# Patient Record
Sex: Male | Born: 1985 | Race: Black or African American | Hispanic: No | Marital: Single | Smoking: Never smoker
Health system: Southern US, Community
[De-identification: ages and names within clinical notes are randomized; demographics above are authoritative.]

## PROBLEM LIST (undated history)

## (undated) DIAGNOSIS — R519 Headache, unspecified: Secondary | ICD-10-CM

## (undated) DIAGNOSIS — Z789 Other specified health status: Secondary | ICD-10-CM

## (undated) HISTORY — PX: WISDOM TOOTH EXTRACTION: SHX21

---
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
## 2016-06-05 ENCOUNTER — Encounter (HOSPITAL_BASED_OUTPATIENT_CLINIC_OR_DEPARTMENT_OTHER): Payer: Self-pay

## 2016-06-05 ENCOUNTER — Emergency Department (HOSPITAL_BASED_OUTPATIENT_CLINIC_OR_DEPARTMENT_OTHER): Payer: Self-pay

## 2016-06-05 ENCOUNTER — Emergency Department (HOSPITAL_BASED_OUTPATIENT_CLINIC_OR_DEPARTMENT_OTHER)
Admission: EM | Admit: 2016-06-05 | Discharge: 2016-06-05 | Disposition: A | Discharge status: Home / Self Care | Payer: Self-pay | Attending: Emergency Medicine | Admitting: Emergency Medicine

## 2016-06-05 DIAGNOSIS — K611 Rectal abscess: Secondary | ICD-10-CM | POA: Insufficient documentation

## 2016-06-05 LAB — BASIC METABOLIC PANEL
Anion gap: 9 (ref 5–15)
BUN: 8 mg/dL (ref 6–20)
CO2: 27 mmol/L (ref 22–32)
Calcium: 9.2 mg/dL (ref 8.9–10.3)
Chloride: 103 mmol/L (ref 101–111)
Creatinine, Ser: 0.71 mg/dL (ref 0.61–1.24)
GFR calc Af Amer: >60 mL/min (ref >60)
GFR calc non Af Amer: >60 mL/min (ref >60)
Glucose, Bld: 117 mg/dL — ABNORMAL HIGH (ref 65–99)
Potassium: 3.3 mmol/L — ABNORMAL LOW (ref 3.5–5.1)
Sodium: 139 mmol/L (ref 135–145)

## 2016-06-05 LAB — CBC WITH DIFFERENTIAL/PLATELET
Basophils Absolute: 0 10*3/uL (ref 0.0–0.1)
Basophils Relative: 0 %
Eosinophils Absolute: 0.1 10*3/uL (ref 0.0–0.7)
Eosinophils Relative: 1 %
HCT: 40.9 % (ref 39.0–52.0)
Hemoglobin: 14.4 g/dL (ref 13.0–17.0)
Lymphocytes Relative: 12 %
Lymphs Abs: 1.5 10*3/uL (ref 0.7–4.0)
MCH: 29.3 pg (ref 26.0–34.0)
MCHC: 35.2 g/dL (ref 30.0–36.0)
MCV: 83.3 fL (ref 78.0–100.0)
Monocytes Absolute: 1 10*3/uL (ref 0.1–1.0)
Monocytes Relative: 8 %
Neutro Abs: 9.8 10*3/uL — ABNORMAL HIGH (ref 1.7–7.7)
Neutrophils Relative %: 79 %
Platelets: 257 10*3/uL (ref 150–400)
RBC: 4.91 MIL/uL (ref 4.22–5.81)
RDW: 13.1 % (ref 11.5–15.5)
WBC: 12.4 10*3/uL — ABNORMAL HIGH (ref 4.0–10.5)

## 2016-06-05 MED ORDER — MORPHINE SULFATE (PF) 4 MG/ML IV SOLN
4.0000 mg | Freq: Once | INTRAVENOUS | Status: AC
  Administered 2016-06-05: 4 mg via INTRAVENOUS
  Filled 2016-06-05: qty 1

## 2016-06-05 MED ORDER — SODIUM CHLORIDE 0.9 % IV BOLUS (SEPSIS)
500.0000 mL | Freq: Once | INTRAVENOUS | Status: AC
  Administered 2016-06-05: 500 mL via INTRAVENOUS

## 2016-06-05 MED ORDER — CLINDAMYCIN HCL 150 MG PO CAPS
150.0000 mg | ORAL_CAPSULE | Freq: Once | ORAL | Status: AC
  Administered 2016-06-05: 150 mg via ORAL
  Filled 2016-06-05: qty 1

## 2016-06-05 MED ORDER — ONDANSETRON HCL 4 MG/2ML IJ SOLN
4.0000 mg | Freq: Once | INTRAMUSCULAR | Status: AC
  Administered 2016-06-05: 4 mg via INTRAVENOUS
  Filled 2016-06-05: qty 2

## 2016-06-05 MED ORDER — HYDROCODONE-ACETAMINOPHEN 5-325 MG PO TABS: 1.0000 | ORAL_TABLET | Freq: Four times a day (QID) | ORAL | 0 refills | Status: DC | PRN

## 2016-06-05 MED ORDER — LIDOCAINE-EPINEPHRINE (PF) 2 %-1:200000 IJ SOLN
10.0000 mL | Freq: Once | INTRAMUSCULAR | Status: AC
  Administered 2016-06-05: 10 mL
  Filled 2016-06-05: qty 10

## 2016-06-05 MED ORDER — PROPOFOL 10 MG/ML IV BOLUS
0.5000 mg/kg | Freq: Once | INTRAVENOUS | Status: AC
  Administered 2016-06-05: 100 mg via INTRAVENOUS
  Filled 2016-06-05: qty 20

## 2016-06-05 MED ORDER — IOPAMIDOL (ISOVUE-300) INJECTION 61%
100.0000 mL | Freq: Once | INTRAVENOUS | Status: AC | PRN
  Administered 2016-06-05: 100 mL via INTRAVENOUS

## 2016-06-05 MED ORDER — CLINDAMYCIN HCL 150 MG PO CAPS: 150.0000 mg | ORAL_CAPSULE | Freq: Four times a day (QID) | ORAL | 0 refills | Status: DC

## 2016-06-05 MED ORDER — PROPOFOL 10 MG/ML IV BOLUS
INTRAVENOUS | Status: AC | PRN
  Administered 2016-06-05: 60 mg via INTRAVENOUS

## 2016-06-05 NOTE — Discharge Instructions (Signed)
Soak 2-3 times a day for 69min at a time with epsom salt.

## 2016-06-05 NOTE — ED Provider Notes (Signed)
[facility] Provider Note   CSN: [id number] Arrival date & time: 06/05/16  1952 By signing my name below, I, Dyke Brackett, attest that this documentation has been prepared under the direction and in the presence of Blanchie Dessert, MD . Electronically Signed: Dyke Brackett, Scribe. 06/05/2016. 8:17 PM.   History   Chief Complaint Chief Complaint  Patient presents with  . Abscess   HPI Comments: Anthony Shannon is an otherwise healthy 31 y.o. male who presents to the Emergency Department complaining of a moderate, gradually worsening area of pain and swelling to the rectum onset four days ago. Pt states pain is exacerbated with palpation and direct pressure. He has taken Epsom salt baths and Motrin with no relief of pain. He notes associated subjective fever and intermittent chills which began a few days ago. Pt was seen at Urgent Care today for the same where he was told he likely has a perirectal abscess and was sent to the ED for further evaluation. The patient is currently on no regular medications. He was tested for HIV 2 years ago, and has since been with 5 sexual partners. Pt states he regularly uses protection during his sexual encounters. He is currently sexually active with one male partner and does not have anal sex. Per pt, he is allergic to cats, but denies any other allergies. He denies drainage from the area or  penile pain and has no other acute complaints at this time.   The history is provided by the patient. No language interpreter was used.   History reviewed. No pertinent past medical history.  There are no active problems to display for this patient.  History reviewed. No pertinent surgical history.   Home Medications    Prior to Admission medications   Not on File    Family History No family history on file.  Social History Social History  Substance Use Topics  . Smoking status: Never Smoker  . Smokeless tobacco: Never Used  . Alcohol  use Yes     Comment: occ    Allergies   Patient has no known allergies.  Review of Systems Review of Systems  All other systems reviewed and are negative. All systems reviewed and are negative for acute change except as noted in the HPI.   Physical Exam Updated Vital Signs BP (!) 155/86 (BP Location: Left Arm)   Pulse 91   Temp 99.5 F (37.5 C) (Oral)   Resp 19   Ht 6' (1.829 m)   Wt 235 lb (106.6 kg)   SpO2 100%   BMI 31.87 kg/m   Physical Exam  Constitutional: He is oriented to person, place, and time. Vital signs are normal. He appears well-developed and well-nourished.  Non-toxic appearance. No distress.  Afebrile, nontoxic, NAD  HENT:  Head: Normocephalic and atraumatic.  Mouth/Throat: Oropharynx is clear and moist and mucous membranes are normal.  Eyes: Conjunctivae and EOM are normal. Right eye exhibits no discharge. Left eye exhibits no discharge.  Neck: Normal range of motion. Neck supple.  Cardiovascular: Normal rate, regular rhythm, normal heart sounds and intact distal pulses.   No murmur heard. Pulmonary/Chest: Effort normal and breath sounds normal. He has no decreased breath sounds. He has no wheezes. He has no rhonchi. He has no rales.  Abdominal: Soft. Normal appearance and bowel sounds are normal. He exhibits no distension. There is no tenderness. There is no rigidity, no rebound, no guarding, no CVA tenderness, no tenderness at McBurney's point and negative Murphy's  sign.  Genitourinary:     Genitourinary Comments: Chaperone (scribe) was present for exam which was performed with no discomfort or complications.    Musculoskeletal: Normal range of motion.  Neurological: He is alert and oriented to person, place, and time. He has normal strength. No sensory deficit.  Skin: Skin is warm, dry and intact. No rash noted.  Psychiatric: He has a normal mood and affect.  Nursing note and vitals reviewed.  ED Treatments / Results  DIAGNOSTIC STUDIES:  Oxygen  Saturation is 100% on RA, normal by my interpretation.    COORDINATION OF CARE:  8:14 PM Will order CT scan. Discussed treatment plan with pt at bedside and pt agreed to plan.   Labs (all labs ordered are listed, but only abnormal results are displayed) Labs Reviewed  CBC WITH DIFFERENTIAL/PLATELET - Abnormal; Notable for the following:       Result Value   WBC 12.4 (*)    Neutro Abs 9.8 (*)    All other components within normal limits  BASIC METABOLIC PANEL - Abnormal; Notable for the following:    Potassium 3.3 (*)    Glucose, Bld 117 (*)    All other components within normal limits    EKG  EKG Interpretation None       Radiology Ct Pelvis W Contrast  Result Date: 06/05/2016 CLINICAL DATA:  Rectal pain and swelling, perirectal abscess. EXAM: CT PELVIS WITH CONTRAST TECHNIQUE: Multidetector CT imaging of the pelvis was performed using the standard protocol following the bolus administration of intravenous contrast. CONTRAST:  125mL ISOVUE-300 IOPAMIDOL (ISOVUE-300) INJECTION 61% COMPARISON:  None. FINDINGS: Urinary Tract: Urinary bladder is decompressed. Distal ureters are decompressed. Bowel: There is perirectal soft tissue fullness with surrounding inflammation, left greater than right. Small fluid collection tracks distally along the left gluteal cleft measuring approximately 2.1 x 1.5 x 2.8 cm. No internal air. Associated subcutaneous edema tracks inferiorly. Remaining pelvic bowel loops are normal. The appendix is normal. Vascular/Lymphatic: Mild bilateral inguinal adenopathy, largest node on the left measures 13 mm. Small bilateral external iliac nodes. Pelvic vasculature appears normal. Reproductive:  Prostate gland is unremarkable. Other:  No pelvic ascites.  No peroneal air. Musculoskeletal: There are no acute or suspicious osseous abnormalities. IMPRESSION: Left perirectal phlegmon with small fluid collection/ abscess measuring 2.1 x 1.5 x 2.8 cm. Electronically Signed   By:  Jeb Levering M.D.   On: 06/05/2016 22:05    Procedures .Sedation Date/Time: 06/05/2016 10:32 PM Performed by: Blanchie Dessert Authorized by: Blanchie Dessert   Consent:    Consent obtained:  Written and verbal   Consent given by:  Patient   Risks discussed:  Prolonged hypoxia resulting in organ damage, prolonged sedation necessitating reversal and respiratory compromise necessitating ventilatory assistance and intubation   Alternatives discussed:  Analgesia without sedation and regional anesthesia Indications:    Procedure performed:  Incision and drainage   Procedure necessitating sedation performed by:  Physician performing sedation   Intended level of sedation:  Moderate (conscious sedation) Pre-sedation assessment:    Time since last food or drink:  6   ASA classification: class 1 - normal, healthy patient     Neck mobility: normal     Mouth opening:  3 or more finger widths   Thyromental distance:  3 finger widths   Mallampati score:  II - soft palate, uvula, fauces visible   Pre-sedation assessments completed and reviewed: airway patency, cardiovascular function, hydration status, mental status, nausea/vomiting, pain level, respiratory function and temperature  History of difficult intubation: no     Pre-sedation assessment completed:  06/05/2016 10:34 PM Immediate pre-procedure details:    Reassessment: Patient reassessed immediately prior to procedure     Reviewed: vital signs, relevant labs/tests and NPO status     Verified: bag valve mask available, emergency equipment available, intubation equipment available, IV patency confirmed, oxygen available and suction available   Procedure details (see MAR for exact dosages):    Sedation start time:  06/05/2016 10:34 PM   Preoxygenation:  Nasal cannula   Sedation:  Propofol   Analgesia:  Morphine   Intra-procedure monitoring:  Blood pressure monitoring, cardiac monitor, continuous pulse oximetry, frequent LOC  assessments and frequent vital sign checks   Intra-procedure events: none     Sedation end time:  06/05/2016 11:00 PM   Total sedation time (minutes):  20 Post-procedure details:    Post-sedation assessment completed:  06/05/2016 11:00 PM   Attendance: Constant attendance by certified staff until patient recovered     Recovery: Patient returned to pre-procedure baseline     Estimated blood loss (see I/O flowsheets): no     Complications:  None   Patient is stable for discharge or admission: yes     Patient tolerance:  Tolerated well, no immediate complications    (including critical care time)  Medications Ordered in ED Medications  morphine 4 MG/ML injection 4 mg (4 mg Intravenous Given 06/05/16 2042)  ondansetron (ZOFRAN) injection 4 mg (4 mg Intravenous Given 06/05/16 2042)  iopamidol (ISOVUE-300) 61 % injection 100 mL (100 mLs Intravenous Contrast Given 06/05/16 2149)     Initial Impression / Assessment and Plan / ED Course  I have reviewed the triage vital signs and the nursing notes.  Pertinent labs & imaging results that were available during my care of the patient were reviewed by me and considered in my medical decision making (see chart for details).     Pt with exam and hx concerning for perirectal abscess.  Pt o/w healthy adult.  No meds.  CBC, BMP and  Pelvic CT pending.  10:30 PM CT with small abscess present as well as phlegmon. Discussed with Dr. Lucia Gaskins and external findings correlate with CT findings. We'll I&D here. Conscious sedation with propofol. I&D as below.  INCISION AND DRAINAGE Performed by: Blanchie Dessert Consent: Verbal consent obtained. Risks and benefits: risks, benefits and alternatives were discussed Type: abscess  Body area: left perirectal abscess  Anesthesia: local infiltration  Incision was made with a scalpel.  Local anesthetic: lidocaine 2% with epinephrine  Anesthetic total: 5 ml  Complexity: complex Blunt dissection to break up  loculations  Drainage: purulent  Drainage amount: 44mL  Packing material:none Patient tolerance: Patient tolerated the procedure well with no immediate complications.     Final Clinical Impressions(s) / ED Diagnoses   Final diagnoses:  Perirectal abscess   New Prescriptions New Prescriptions   CLINDAMYCIN (CLEOCIN) 150 MG CAPSULE    Take 1 capsule (150 mg total) by mouth every 6 (six) hours.   HYDROCODONE-ACETAMINOPHEN (NORCO/VICODIN) 5-325 MG TABLET    Take 1-2 tablets by mouth every 6 (six) hours as needed.   I personally performed the services described in this documentation, which was scribed in my presence.  The recorded information has been reviewed and considered.    Blanchie Dessert, MD 06/05/16 [phone]

## 2016-06-05 NOTE — ED Triage Notes (Signed)
c/o rectal abscess x 4-5 days-sent from [facility]-steady gait

## 2019-10-16 ENCOUNTER — Emergency Department (HOSPITAL_BASED_OUTPATIENT_CLINIC_OR_DEPARTMENT_OTHER): Payer: No Typology Code available for payment source

## 2019-10-16 ENCOUNTER — Other Ambulatory Visit: Payer: Self-pay

## 2019-10-16 ENCOUNTER — Emergency Department (HOSPITAL_BASED_OUTPATIENT_CLINIC_OR_DEPARTMENT_OTHER)
Admission: EM | Admit: 2019-10-16 | Discharge: 2019-10-16 | Disposition: A | Discharge status: Home / Self Care | Payer: No Typology Code available for payment source | Attending: Emergency Medicine | Admitting: Emergency Medicine

## 2019-10-16 ENCOUNTER — Encounter (HOSPITAL_BASED_OUTPATIENT_CLINIC_OR_DEPARTMENT_OTHER): Payer: Self-pay

## 2019-10-16 DIAGNOSIS — S8002XA Contusion of left knee, initial encounter: Secondary | ICD-10-CM

## 2019-10-16 DIAGNOSIS — Y92410 Unspecified street and highway as the place of occurrence of the external cause: Secondary | ICD-10-CM | POA: Insufficient documentation

## 2019-10-16 DIAGNOSIS — S8992XA Unspecified injury of left lower leg, initial encounter: Secondary | ICD-10-CM | POA: Diagnosis present

## 2019-10-16 DIAGNOSIS — R0789 Other chest pain: Secondary | ICD-10-CM | POA: Insufficient documentation

## 2019-10-16 DIAGNOSIS — M25511 Pain in right shoulder: Secondary | ICD-10-CM | POA: Insufficient documentation

## 2019-10-16 MED ORDER — IBUPROFEN 800 MG PO TABS: 800.0000 mg | ORAL_TABLET | Freq: Three times a day (TID) | ORAL | 0 refills | Status: DC | PRN

## 2019-10-16 MED ORDER — METHOCARBAMOL 500 MG PO TABS: 500.0000 mg | ORAL_TABLET | Freq: Three times a day (TID) | ORAL | 0 refills | Status: DC | PRN

## 2019-10-16 MED ORDER — DICLOFENAC SODIUM 1 % EX GEL: 2.0000 g | Freq: Four times a day (QID) | CUTANEOUS | 0 refills | Status: DC | PRN
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
## 2019-10-16 NOTE — ED Notes (Signed)
Patient ambulated to xray and back with steady gait
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
## 2019-12-05 ENCOUNTER — Telehealth: Payer: Self-pay | Admitting: Family Medicine

## 2019-12-05 NOTE — Telephone Encounter (Signed)
Called patient to offer ED f/u appt--no answer, message left. --glh

## 2022-01-02 ENCOUNTER — Encounter: Payer: Self-pay | Admitting: Plastic Surgery

## 2022-01-02 ENCOUNTER — Ambulatory Visit (INDEPENDENT_AMBULATORY_CARE_PROVIDER_SITE_OTHER): Payer: No Typology Code available for payment source | Admitting: Plastic Surgery

## 2022-01-02 VITALS — BP 148/80 | HR 88 | Ht 73.0 in | Wt 247.0 lb

## 2022-01-02 DIAGNOSIS — L720 Epidermal cyst: Secondary | ICD-10-CM

## 2022-01-02 DIAGNOSIS — R222 Localized swelling, mass and lump, trunk: Secondary | ICD-10-CM | POA: Diagnosis not present

## 2022-01-02 DIAGNOSIS — L989 Disorder of the skin and subcutaneous tissue, unspecified: Secondary | ICD-10-CM

## 2022-01-02 DIAGNOSIS — D171 Benign lipomatous neoplasm of skin and subcutaneous tissue of trunk: Secondary | ICD-10-CM

## 2022-01-02 NOTE — Progress Notes (Signed)
Referring Provider No referring provider defined for this encounter.   CC:  Chief Complaint  Patient presents with   Consult      Anthony Shannon is an 36 y.o. male.  HPI: Anthony Shannon is a 36 year old male who presents today for evaluation of 2 cysts on his forehead 2 cysts on his chest and a lipoma on his back.  The cyst have been present for 1 to 2 years per the patient's history.  The lipoma has been present for approximately 8 years.  All of the lesions have been slowly growing and the lipoma has become painful.  Is requesting that I remove all of the lesions for him.  No Known Allergies  Outpatient Encounter Medications as of 01/02/2022  Medication Sig   HYDROcodone-acetaminophen (NORCO/VICODIN) 5-325 MG tablet Take 1-2 tablets by mouth every 6 (six) hours as needed.   [DISCONTINUED] clindamycin (CLEOCIN) 150 MG capsule Take 1 capsule (150 mg total) by mouth every 6 (six) hours.   [DISCONTINUED] diclofenac Sodium (VOLTAREN) 1 % GEL Apply 2 g topically 4 (four) times daily as needed.   [DISCONTINUED] ibuprofen (ADVIL) 800 MG tablet Take 1 tablet (800 mg total) by mouth every 8 (eight) hours as needed.   [DISCONTINUED] methocarbamol (ROBAXIN) 500 MG tablet Take 1 tablet (500 mg total) by mouth every 8 (eight) hours as needed for muscle spasms.   No facility-administered encounter medications on file as of 01/02/2022.     No past medical history on file.  No past surgical history on file.  No family history on file.  Social History   Social History Narrative   Not on file     Review of Systems General: Denies fevers, chills, weight loss CV: Denies chest pain, shortness of breath, palpitations Skin: 2 small cysts on the forehead between the eyes.  2 small cysts on the anterior chest 1 on the left side 1 on the right side.  Lipoma on the back which the patient says is painful.  None of these lesions have drained none are erythematous.  Physical Exam    01/02/2022     8:11 AM 10/16/2019   11:04 PM 10/16/2019    9:48 PM  Vitals with BMI  Height '6\' 1"'$     Weight 247 lbs    BMI 80.22    Systolic 336 122 449  Diastolic 80 95 90  Pulse 88 60 71    General:  No acute distress,  Alert and oriented, Non-Toxic, Normal speech and affect Integument: Lesions as noted.  The 2 lesions on the forehead and the 2 on the chest appear to be sebaceous cysts.  The lesion on the back which measures 7 x 7 cm is consistent on physical exam with a lipoma.   Assessment/Plan Sebaceous cyst: The patient has 4 lesions consistent with sebaceous or epidermal inclusion cyst.  These will be excised primarily the 2 on the face closed with permanent sutures which will need to be removed postoperatively and of the 2 on the chest probably closed with absorbable sutures. Lipoma: The lesion on the back is probably a lipoma.  Due to the location and the size of the lesion I believe this would be better treated in the operating room.  Since we are going to do this in the operating room I will just plan to remove all of the lesions at the same time. The risks of bleeding infection recurrence and scar formation were all discussed with the patient preoperatively.  Additionally we  discussed the possibility of using a drain for the lipoma.  He understands all of these risks and agrees with the plan and request that I proceed with surgery.  All questions were answered to his satisfaction.  [name] 01/02/2022, 8:32 AM
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
## 2022-02-19 ENCOUNTER — Telehealth: Payer: Self-pay | Admitting: Plastic Surgery

## 2022-02-19 NOTE — Telephone Encounter (Signed)
Pt calling to check on surgery and insurance auth and would like a call back.

## 2022-03-04 ENCOUNTER — Telehealth: Payer: Self-pay | Admitting: Plastic Surgery

## 2022-03-04 ENCOUNTER — Telehealth: Payer: Self-pay | Admitting: *Deleted

## 2022-03-04 NOTE — Telephone Encounter (Signed)
[facility] - READY TO SCHEDULE SURGERY - POSS 02.20.24

## 2022-03-04 NOTE — Telephone Encounter (Signed)
Spoke to patient earlier to schedule sx and preop. Checked best days and times prior to scheduling post-ops, notified patient he would get sx schedule at pre-op appt

## 2022-03-04 NOTE — Telephone Encounter (Signed)
Patient returned call for scheduler and she was unavailable.  She will call pt back.

## 2022-03-05 NOTE — H&P (View-Only) (Signed)
Patient ID: Anthony Shannon, male    DOB: 08/29/1985, 37 y.o.   MRN: [id number]  Chief Complaint  Patient presents with   Pre-op Exam      ICD-10-CM   1. 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 Epidermal inclusion cyst  L72.0        History of Present Illness: Anthony Shannon is a 37 y.o.  male  with a history of multiple cystic lesions as well as a large soft tissue mass on the back.  He presents for preoperative evaluation for upcoming procedure, surgical excision of cystic lesions and the soft tissue mass, scheduled for 03/21/2022 with Dr.  Lovena Le .  The patient had wisdom teeth extraction without any complication or difficulty with anesthesia.  No other history of surgeries.  He is otherwise healthy.  He does not take medications regularly aside from a multivitamin which he will hold 7 days prior to surgery.  Denies any personal history of cardiac or pulmonary disease, blood thinners, or cancer.  No personal or family history of blood clots or clotting disorder.  Denies any history of keloiding/hypertrophic scarring.  His mother will be driving him to and from surgery and will assist with postoperative recovery that first night.  He understands that he may require a postoperative drain for the lipoma excision site. He reports palpating a third cystic lesion on center of chest between his other two chest cysts that he is hoping can be added for surgical excision.    Summary of Previous Visit: Patient was seen for consult on 01/02/2022 by Dr. Lovena Le.  At that time, he complained of two cystic lesions on his forehead and two cystic lesions on his chest.  He also expressed interest in surgical excision of a large soft tissue mass on his back which he states has been there for approximately 8 years.  The cystic lesions were felt to be consistent with sebaceous or epidermal inclusion cysts.  The facial lesions would be closed with permanent sutures and the 2 on the chest would be closed with absorbable  sutures.  Given the size of the lipoma, plan was to excise all of the lesions in the OR.  Discussed the risks of surgery as well as the possible need for a drain postoperatively.  Patient voiced understanding and was agreeable with the plan.  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   PMH Significant for: Multiple cystic lesions, back soft tissue mass.   Past Medical History: Allergies: No Known Allergies  Current Medications:  Current Outpatient Medications:    HYDROcodone-acetaminophen (NORCO/VICODIN) 5-325 MG tablet, Take 1-2 tablets by mouth every 6 (six) hours as needed., Disp: 10 tablet, Rfl: 0   ondansetron (ZOFRAN-ODT) 4 MG disintegrating tablet, Take 1 tablet (4 mg total) by mouth every 8 (eight) hours as needed for nausea or vomiting., Disp: 20 tablet, Rfl: 0   oxyCODONE (ROXICODONE) 5 MG immediate release tablet, Take 1 tablet (5 mg total) by mouth every 6 (six) hours as needed for up to 5 days for severe pain., Disp: 20 tablet, Rfl: 0  Past Medical Problems: No past medical history on file.  Past Surgical History: No past surgical history on file.  Social History: Social History   Socioeconomic History   Marital status: Single    Spouse name: Not on file   Number of children: Not on file   Years of education: Not on file  Highest education level: Not on file  Occupational History   Not on file  Tobacco Use   Smoking status: Never   Smokeless tobacco: Never  Substance and Sexual Activity   Alcohol use: Yes    Comment: occ   Drug use: No   Sexual activity: Not on file  Other Topics Concern   Not on file  Social History Narrative   Not on file   Social Determinants of Health   Financial Resource Strain: Not on file  Food Insecurity: Not on file  Transportation Needs: Not on file  Physical Activity: Not on file  Stress: Not on file  Social Connections: Not on file  Intimate Partner Violence: Not on file    Family  History: No family history on file.  Review of Systems: ROS Denies any recent chest pain, difficulty breathing, leg swelling, or fevers.  Physical Exam: Vital Signs BP 121/74 (BP Location: Left Arm, Patient Position: Sitting, Cuff Size: Large)   Pulse 75   Ht '6\' 1"'$  (1.854 m)   Wt 260 lb 9.6 oz (118.2 kg)   SpO2 97%   BMI 34.38 kg/m   Physical Exam Constitutional:      General: Not in acute distress.    Appearance: Normal appearance. Not ill-appearing.  HENT:     Head: Normocephalic and atraumatic.  Eyes:     Pupils: Pupils are equal, round. Cardiovascular:     Rate and Rhythm: Normal rate.    Pulses: Normal pulses.  Pulmonary:     Effort: No respiratory distress or increased work of breathing.  Speaks in full sentences. Abdominal:     General: Abdomen is flat. No distension.   Musculoskeletal: Normal range of motion. No lower extremity swelling or edema. Skin:    General: Skin is warm and dry.     Findings: No erythema or rash.  Neurological:     Mental Status: Alert and oriented to person, place, and time.  Psychiatric:        Mood and Affect: Mood normal.        Behavior: Behavior normal.    Assessment/Plan: The patient is scheduled for surgical excision of cystic lesions and the soft tissue mass with Dr.  Lovena Le .  Risks, benefits, and alternatives of procedure discussed, questions answered and consent obtained.    Smoking Status: Non-smoker.  Caprini Score: 3; Risk Factors include: BMI greater than 25 and length of planned surgery. Recommendation for mechanical  prophylaxis. Encourage early ambulation.   Pictures obtained: 01/02/2022  Post-op Rx sent to pharmacy: Oxycodone and Zofran.  Patient was provided with the General Surgical Risk consent document and Pain Medication Agreement prior to their appointment.  They had adequate time to read through the risk consent documents and Pain Medication Agreement. We also discussed them in person together during this  preop appointment. All of their questions were answered to their satisfaction.  Recommended calling if they have any further questions.  Risk consent form and Pain Medication Agreement to be scanned into patient's chart.    Electronically signed by: Krista Blue, PA-C 03/06/2022 2:13 PM

## 2022-03-05 NOTE — Progress Notes (Addendum)
Patient ID: Anthony Shannon, male    DOB: 1985/04/16, 37 y.o.   MRN: [id number]  Chief Complaint  Patient presents with   Pre-op Exam      ICD-10-CM   1. 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 Epidermal inclusion cyst  L72.0        History of Present Illness: Anthony Shannon is a 37 y.o.  male  with a history of multiple cystic lesions as well as a large soft tissue mass on the back.  He presents for preoperative evaluation for upcoming procedure, surgical excision of cystic lesions and the soft tissue mass, scheduled for 03/21/2022 with Dr.  Lovena Le .  The patient {HAS HAS [id number] had problems with anesthesia. ***  Summary of Previous Visit: Patient was seen for consult on 01/02/2022 by Dr. Lovena Le.  At that time, he complained of two cystic lesions on his forehead and two cystic lesions on his chest.  He also expressed interest in surgical excision of a large soft tissue mass on his back which he states has been there for approximately 8 years.  The cystic lesions were felt to be consistent with sebaceous or epidermal inclusion cysts.  The facial lesions would be closed with permanent sutures and the 2 on the chest would be closed with absorbable sutures.  Given the size of the lipoma, plan was to excise all of the lesions in the OR.  Discussed the risks of surgery as well as the possible need for a drain postoperatively.  Patient voiced understanding and was agreeable with the plan.  Job: ***  PMH Significant for: Multiple cystic lesions, back soft tissue mass.   Past Medical History: Allergies: No Known Allergies  Current Medications:  Current Outpatient Medications:    HYDROcodone-acetaminophen (NORCO/VICODIN) 5-325 MG tablet, Take 1-2 tablets by mouth every 6 (six) hours as needed., Disp: 10 tablet, Rfl: 0  Past Medical Problems: No past medical history on file.  Past Surgical History: No past surgical history on file.  Social History: Social History   Socioeconomic  History   Marital status: Single    Spouse name: Not on file   Number of children: Not on file   Years of education: Not on file   Highest education level: Not on file  Occupational History   Not on file  Tobacco Use   Smoking status: Never   Smokeless tobacco: Never  Substance and Sexual Activity   Alcohol use: Yes    Comment: occ   Drug use: No   Sexual activity: Not on file  Other Topics Concern   Not on file  Social History Narrative   Not on file   Social Determinants of Health   Financial Resource Strain: Not on file  Food Insecurity: Not on file  Transportation Needs: Not on file  Physical Activity: Not on file  Stress: Not on file  Social Connections: Not on file  Intimate Partner Violence: Not on file    Family History: No family history on file.  Review of Systems: ROS  Physical Exam: Vital Signs There were no vitals taken for this visit.  Physical Exam *** Constitutional:      General: Not in acute distress.    Appearance: Normal appearance. Not ill-appearing.  HENT:     Head: Normocephalic and atraumatic.  Eyes:     Pupils: Pupils are equal, round. Cardiovascular:     Rate and Rhythm: Normal rate.    Pulses: Normal pulses.  Pulmonary:     Effort: No respiratory distress or increased work of breathing.  Speaks in full sentences. Abdominal:     General: Abdomen is flat. No distension.   Musculoskeletal: Normal range of motion. No lower extremity swelling or edema. No varicosities. *** Skin:    General: Skin is warm and dry.     Findings: No erythema or rash.  Neurological:     Mental Status: Alert and oriented to person, place, and time.  Psychiatric:        Mood and Affect: Mood normal.        Behavior: Behavior normal.    Assessment/Plan: The patient is scheduled for surgical excision of cystic lesions and the soft tissue mass with Dr.  Lovena Le .  Risks, benefits, and alternatives of procedure discussed, questions answered and consent  obtained.    Smoking Status: ***; Counseling Given? ***  Caprini Score: ***; Risk Factors include: ***, BMI *** 25, and length of planned surgery. Recommendation for mechanical *** pharmacological prophylaxis. Encourage early ambulation.   Pictures obtained: 01/02/2022  Post-op Rx sent to pharmacy: ***  Patient was provided with the General Surgical Risk consent document and Pain Medication Agreement prior to their appointment.  They had adequate time to read through the risk consent documents and Pain Medication Agreement. We also discussed them in person together during this preop appointment. All of their questions were answered to their satisfaction.  Recommended calling if they have any further questions.  Risk consent form and Pain Medication Agreement to be scanned into patient's chart.    Electronically signed by: Krista Blue, PA-C 03/05/2022 1:16 PM

## 2022-03-06 ENCOUNTER — Ambulatory Visit (INDEPENDENT_AMBULATORY_CARE_PROVIDER_SITE_OTHER): Payer: No Typology Code available for payment source | Admitting: Physician Assistant

## 2022-03-06 VITALS — BP 121/74 | HR 75 | Ht 73.0 in | Wt 260.6 lb

## 2022-03-06 DIAGNOSIS — D171 Benign lipomatous neoplasm of skin and subcutaneous tissue of trunk: Secondary | ICD-10-CM

## 2022-03-06 DIAGNOSIS — L720 Epidermal cyst: Secondary | ICD-10-CM

## 2022-03-06 MED ORDER — OXYCODONE HCL 5 MG PO TABS: 5.0000 mg | ORAL_TABLET | Freq: Four times a day (QID) | ORAL | 0 refills | Status: DC | PRN

## 2022-03-06 MED ORDER — ONDANSETRON 4 MG PO TBDP: 4.0000 mg | ORAL_TABLET | Freq: Three times a day (TID) | ORAL | 0 refills | Status: AC | PRN

## 2022-03-06 MED ORDER — OXYCODONE HCL 5 MG PO TABS: 5.0000 mg | ORAL_TABLET | Freq: Four times a day (QID) | ORAL | 0 refills | Status: AC | PRN

## 2022-03-06 NOTE — Addendum Note (Signed)
Addended by: Krista Blue on: 03/06/2022 03:06 PM   Modules accepted: Orders

## 2022-03-14 ENCOUNTER — Other Ambulatory Visit: Payer: Self-pay

## 2022-03-14 ENCOUNTER — Encounter (HOSPITAL_BASED_OUTPATIENT_CLINIC_OR_DEPARTMENT_OTHER): Payer: Self-pay | Admitting: Plastic Surgery

## 2022-03-20 ENCOUNTER — Encounter (HOSPITAL_BASED_OUTPATIENT_CLINIC_OR_DEPARTMENT_OTHER): Payer: Self-pay | Admitting: Plastic Surgery

## 2022-03-21 ENCOUNTER — Encounter (HOSPITAL_BASED_OUTPATIENT_CLINIC_OR_DEPARTMENT_OTHER): Payer: Self-pay | Admitting: Plastic Surgery

## 2022-03-21 ENCOUNTER — Ambulatory Visit (HOSPITAL_BASED_OUTPATIENT_CLINIC_OR_DEPARTMENT_OTHER)
Admission: RE | Admit: 2022-03-21 | Discharge: 2022-03-21 | Disposition: A | Discharge status: Home / Self Care | Payer: No Typology Code available for payment source | Attending: Plastic Surgery | Admitting: Plastic Surgery

## 2022-03-21 ENCOUNTER — Other Ambulatory Visit: Payer: Self-pay

## 2022-03-21 ENCOUNTER — Ambulatory Visit (HOSPITAL_BASED_OUTPATIENT_CLINIC_OR_DEPARTMENT_OTHER): Payer: No Typology Code available for payment source | Admitting: Anesthesiology

## 2022-03-21 ENCOUNTER — Encounter (HOSPITAL_BASED_OUTPATIENT_CLINIC_OR_DEPARTMENT_OTHER)
Admission: RE | Disposition: A | Discharge status: Home / Self Care | Payer: Self-pay | Source: Home / Self Care | Attending: Plastic Surgery

## 2022-03-21 DIAGNOSIS — L720 Epidermal cyst: Secondary | ICD-10-CM | POA: Diagnosis not present

## 2022-03-21 DIAGNOSIS — E669 Obesity, unspecified: Secondary | ICD-10-CM | POA: Diagnosis not present

## 2022-03-21 DIAGNOSIS — L729 Follicular cyst of the skin and subcutaneous tissue, unspecified: Secondary | ICD-10-CM

## 2022-03-21 DIAGNOSIS — M7989 Other specified soft tissue disorders: Secondary | ICD-10-CM

## 2022-03-21 DIAGNOSIS — Z6834 Body mass index (BMI) 34.0-34.9, adult: Secondary | ICD-10-CM | POA: Insufficient documentation

## 2022-03-21 DIAGNOSIS — R519 Headache, unspecified: Secondary | ICD-10-CM | POA: Diagnosis not present

## 2022-03-21 DIAGNOSIS — D171 Benign lipomatous neoplasm of skin and subcutaneous tissue of trunk: Secondary | ICD-10-CM

## 2022-03-21 HISTORY — DX: Headache, unspecified: R51.9

## 2022-03-21 HISTORY — DX: Other specified health status: Z78.9

## 2022-03-21 HISTORY — PX: CYST EXCISION: SHX5701

## 2022-03-21 HISTORY — PX: LIPOMA EXCISION: SHX5283

## 2022-03-21 SURGERY — EXCISION LIPOMA
Anesthesia: General | Site: Face

## 2022-03-21 MED ORDER — EPHEDRINE 5 MG/ML INJ
INTRAVENOUS | Status: AC
  Filled 2022-03-21: qty 5

## 2022-03-21 MED ORDER — SUGAMMADEX SODIUM 200 MG/2ML IV SOLN
INTRAVENOUS | Status: DC | PRN
  Administered 2022-03-21: 200 mg via INTRAVENOUS

## 2022-03-21 MED ORDER — FENTANYL CITRATE (PF) 100 MCG/2ML IJ SOLN
INTRAMUSCULAR | Status: AC
  Filled 2022-03-21: qty 2

## 2022-03-21 MED ORDER — BACITRACIN ZINC 500 UNIT/GM EX OINT
TOPICAL_OINTMENT | CUTANEOUS | Status: AC
  Filled 2022-03-21: qty 0.9

## 2022-03-21 MED ORDER — PROMETHAZINE HCL 25 MG/ML IJ SOLN
INTRAMUSCULAR | Status: AC
  Filled 2022-03-21: qty 1

## 2022-03-21 MED ORDER — CEFAZOLIN SODIUM-DEXTROSE 2-4 GM/100ML-% IV SOLN
2.0000 g | INTRAVENOUS | Status: AC
  Administered 2022-03-21: 2 g via INTRAVENOUS

## 2022-03-21 MED ORDER — ONDANSETRON HCL 4 MG/2ML IJ SOLN
INTRAMUSCULAR | Status: AC
  Filled 2022-03-21: qty 2

## 2022-03-21 MED ORDER — CHLORHEXIDINE GLUCONATE CLOTH 2 % EX PADS: 6.0000 | MEDICATED_PAD | Freq: Once | CUTANEOUS | Status: DC

## 2022-03-21 MED ORDER — MIDAZOLAM HCL 2 MG/2ML IJ SOLN
INTRAMUSCULAR | Status: AC
  Filled 2022-03-21: qty 2

## 2022-03-21 MED ORDER — PROPOFOL 10 MG/ML IV BOLUS
INTRAVENOUS | Status: AC
  Filled 2022-03-21: qty 20

## 2022-03-21 MED ORDER — FENTANYL CITRATE (PF) 100 MCG/2ML IJ SOLN
INTRAMUSCULAR | Status: DC | PRN
  Administered 2022-03-21: 100 ug via INTRAVENOUS

## 2022-03-21 MED ORDER — ONDANSETRON HCL 4 MG/2ML IJ SOLN
INTRAMUSCULAR | Status: DC | PRN
  Administered 2022-03-21: 4 mg via INTRAVENOUS

## 2022-03-21 MED ORDER — OXYCODONE HCL 5 MG PO TABS: 5.0000 mg | ORAL_TABLET | Freq: Four times a day (QID) | ORAL | 0 refills | Status: AC | PRN

## 2022-03-21 MED ORDER — LIDOCAINE HCL (CARDIAC) PF 100 MG/5ML IV SOSY
PREFILLED_SYRINGE | INTRAVENOUS | Status: DC | PRN
  Administered 2022-03-21: 100 mg via INTRAVENOUS

## 2022-03-21 MED ORDER — BACITRACIN ZINC 500 UNIT/GM EX OINT
TOPICAL_OINTMENT | CUTANEOUS | Status: AC
  Filled 2022-03-21: qty 28.35

## 2022-03-21 MED ORDER — DEXAMETHASONE SODIUM PHOSPHATE 10 MG/ML IJ SOLN
INTRAMUSCULAR | Status: AC
  Filled 2022-03-21: qty 1

## 2022-03-21 MED ORDER — ACETAMINOPHEN 500 MG PO TABS
1000.0000 mg | ORAL_TABLET | Freq: Once | ORAL | Status: AC
  Administered 2022-03-21: 1000 mg via ORAL

## 2022-03-21 MED ORDER — CEFAZOLIN SODIUM-DEXTROSE 2-4 GM/100ML-% IV SOLN
INTRAVENOUS | Status: AC
  Filled 2022-03-21: qty 100

## 2022-03-21 MED ORDER — DEXAMETHASONE SODIUM PHOSPHATE 4 MG/ML IJ SOLN
INTRAMUSCULAR | Status: DC | PRN
  Administered 2022-03-21: 10 mg via INTRAVENOUS

## 2022-03-21 MED ORDER — ROCURONIUM BROMIDE 100 MG/10ML IV SOLN
INTRAVENOUS | Status: DC | PRN
  Administered 2022-03-21: 60 mg via INTRAVENOUS

## 2022-03-21 MED ORDER — MIDAZOLAM HCL 5 MG/5ML IJ SOLN
INTRAMUSCULAR | Status: DC | PRN
  Administered 2022-03-21: 2 mg via INTRAVENOUS

## 2022-03-21 MED ORDER — FENTANYL CITRATE (PF) 100 MCG/2ML IJ SOLN: 25.0000 ug | INTRAMUSCULAR | Status: DC | PRN

## 2022-03-21 MED ORDER — ACETAMINOPHEN 500 MG PO TABS
ORAL_TABLET | ORAL | Status: AC
  Filled 2022-03-21: qty 2

## 2022-03-21 MED ORDER — PROPOFOL 10 MG/ML IV BOLUS
INTRAVENOUS | Status: DC | PRN
  Administered 2022-03-21: 200 mg via INTRAVENOUS

## 2022-03-21 MED ORDER — PROMETHAZINE HCL 25 MG/ML IJ SOLN
6.2500 mg | INTRAMUSCULAR | Status: DC | PRN
  Administered 2022-03-21: 6.25 mg via INTRAVENOUS

## 2022-03-21 MED ORDER — EPHEDRINE SULFATE (PRESSORS) 50 MG/ML IJ SOLN
INTRAMUSCULAR | Status: DC | PRN
  Administered 2022-03-21: 15 mg via INTRAVENOUS

## 2022-03-21 MED ORDER — LIDOCAINE-EPINEPHRINE 1 %-1:100000 IJ SOLN
INTRAMUSCULAR | Status: DC | PRN
  Administered 2022-03-21: 13 mL

## 2022-03-21 MED ORDER — LACTATED RINGERS IV SOLN: INTRAVENOUS | Status: DC

## 2022-03-21 MED ORDER — BACITRACIN 500 UNIT/GM EX OINT
TOPICAL_OINTMENT | CUTANEOUS | Status: DC | PRN
  Administered 2022-03-21: 2 via TOPICAL

## 2022-03-21 MED ORDER — 0.9 % SODIUM CHLORIDE (POUR BTL) OPTIME
TOPICAL | Status: DC | PRN
  Administered 2022-03-21: 1000 mL

## 2022-03-21 SURGICAL SUPPLY — 76 items
ADH SKN CLS APL DERMABOND .7 (GAUZE/BANDAGES/DRESSINGS) ×2
APL PRP STRL LF DISP 70% ISPRP (MISCELLANEOUS)
APL SKNCLS STERI-STRIP NONHPOA (GAUZE/BANDAGES/DRESSINGS)
BAND INSRT 18 STRL LF DISP RB (MISCELLANEOUS)
BAND RUBBER #18 3X1/16 STRL (MISCELLANEOUS) IMPLANT
BENZOIN TINCTURE PRP APPL 2/3 (GAUZE/BANDAGES/DRESSINGS) IMPLANT
BLADE CLIPPER SURG (BLADE) IMPLANT
BLADE SURG 15 STRL LF DISP TIS (BLADE) ×3 IMPLANT
BLADE SURG 15 STRL SS (BLADE) ×6
CANISTER SUCT 1200ML W/VALVE (MISCELLANEOUS) IMPLANT
CHLORAPREP W/TINT 26 (MISCELLANEOUS) ×3 IMPLANT
COVER BACK TABLE 60X90IN (DRAPES) ×3 IMPLANT
COVER MAYO STAND STRL (DRAPES) ×3 IMPLANT
DERMABOND ADVANCED .7 DNX12 (GAUZE/BANDAGES/DRESSINGS) IMPLANT
DRAIN JP 10F RND SILICONE (MISCELLANEOUS) IMPLANT
DRAPE LAPAROTOMY 100X72 PEDS (DRAPES) IMPLANT
DRAPE U-SHAPE 76X120 STRL (DRAPES) IMPLANT
DRAPE UTILITY XL STRL (DRAPES) ×3 IMPLANT
DRESSING MEPILEX FLEX 4X4 (GAUZE/BANDAGES/DRESSINGS) IMPLANT
DRSG MEPILEX FLEX 4X4 (GAUZE/BANDAGES/DRESSINGS) ×2
DRSG TELFA 3X8 NADH STRL (GAUZE/BANDAGES/DRESSINGS) IMPLANT
ELECT COATED BLADE 2.86 ST (ELECTRODE) IMPLANT
ELECT NDL BLADE 2-5/6 (NEEDLE) ×3 IMPLANT
ELECT NEEDLE BLADE 2-5/6 (NEEDLE) ×2 IMPLANT
ELECT REM PT RETURN 9FT ADLT (ELECTROSURGICAL)
ELECT REM PT RETURN 9FT PED (ELECTROSURGICAL) ×2
ELECTRODE REM PT RETRN 9FT PED (ELECTROSURGICAL) IMPLANT
ELECTRODE REM PT RTRN 9FT ADLT (ELECTROSURGICAL) IMPLANT
EVACUATOR SILICONE 100CC (DRAIN) IMPLANT
GAUZE SPONGE 4X4 12PLY STRL LF (GAUZE/BANDAGES/DRESSINGS) IMPLANT
GAUZE XEROFORM 1X8 LF (GAUZE/BANDAGES/DRESSINGS) IMPLANT
GLOVE BIO SURGEON STRL SZ 6.5 (GLOVE) IMPLANT
GLOVE BIO SURGEON STRL SZ7.5 (GLOVE) IMPLANT
GLOVE BIO SURGEON STRL SZ8 (GLOVE) ×3 IMPLANT
GLOVE BIOGEL M STRL SZ7.5 (GLOVE) ×3 IMPLANT
GLOVE BIOGEL PI IND STRL 7.0 (GLOVE) IMPLANT
GLOVE BIOGEL PI IND STRL 7.5 (GLOVE) ×3 IMPLANT
GLOVE BIOGEL PI IND STRL 8 (GLOVE) IMPLANT
GLOVE ECLIPSE 6.5 STRL STRAW (GLOVE) IMPLANT
GOWN STRL REUS W/ TWL LRG LVL3 (GOWN DISPOSABLE) ×6 IMPLANT
GOWN STRL REUS W/TWL LRG LVL3 (GOWN DISPOSABLE) ×6 IMPLANT
NDL HYPO 27GX1-1/4 (NEEDLE) ×3 IMPLANT
NDL HYPO 30GX1 BEV (NEEDLE) IMPLANT
NDL SPNL 18GX3.5 QUINCKE PK (NEEDLE) IMPLANT
NEEDLE HYPO 27GX1-1/4 (NEEDLE) ×2 IMPLANT
NEEDLE HYPO 30GX1 BEV (NEEDLE) IMPLANT
NEEDLE SPNL 18GX3.5 QUINCKE PK (NEEDLE) IMPLANT
NS IRRIG 1000ML POUR BTL (IV SOLUTION) IMPLANT
PACK BASIN DAY SURGERY FS (CUSTOM PROCEDURE TRAY) ×3 IMPLANT
PACK UNIVERSAL I (CUSTOM PROCEDURE TRAY) IMPLANT
PENCIL SMOKE EVACUATOR (MISCELLANEOUS) ×3 IMPLANT
SHEET MEDIUM DRAPE 40X70 STRL (DRAPES) IMPLANT
SLEEVE SCD COMPRESS KNEE MED (STOCKING) IMPLANT
SPONGE GAUZE 2X2 8PLY STRL LF (GAUZE/BANDAGES/DRESSINGS) IMPLANT
SPONGE T-LAP 18X18 ~~LOC~~+RFID (SPONGE) IMPLANT
STAPLER VISISTAT 35W (STAPLE) ×3 IMPLANT
STRIP CLOSURE SKIN 1/2X4 (GAUZE/BANDAGES/DRESSINGS) IMPLANT
SUCTION FRAZIER HANDLE 10FR (MISCELLANEOUS) ×2
SUCTION TUBE FRAZIER 10FR DISP (MISCELLANEOUS) IMPLANT
SUT ETHILON 4 0 PS 2 18 (SUTURE) IMPLANT
SUT MNCRL AB 3-0 PS2 27 (SUTURE) IMPLANT
SUT MNCRL AB 4-0 PS2 18 (SUTURE) IMPLANT
SUT MON AB 5-0 P3 18 (SUTURE) IMPLANT
SUT PDS 3-0 CT2 (SUTURE)
SUT PDS II 3-0 CT2 27 ABS (SUTURE) IMPLANT
SUT PLAIN 5 0 P 3 18 (SUTURE) IMPLANT
SUT PROLENE 5 0 P 3 (SUTURE) IMPLANT
SUT VICRYL 4-0 PS2 18IN ABS (SUTURE) IMPLANT
SWAB COLLECTION DEVICE MRSA (MISCELLANEOUS) IMPLANT
SWAB CULTURE ESWAB REG 1ML (MISCELLANEOUS) IMPLANT
SYR BULB EAR ULCER 3OZ GRN STR (SYRINGE) IMPLANT
SYR CONTROL 10ML LL (SYRINGE) ×3 IMPLANT
TOWEL GREEN STERILE FF (TOWEL DISPOSABLE) ×3 IMPLANT
TRAY DSU PREP LF (CUSTOM PROCEDURE TRAY) IMPLANT
TUBE CONNECTING 20X1/4 (TUBING) IMPLANT
TUBING INFILTRATION IT-10001 (TUBING) IMPLANT

## 2022-03-21 NOTE — Op Note (Signed)
DATE OF OPERATION: 03/21/2022  LOCATION: [facility] operating Room  PREOPERATIVE DIAGNOSIS: Soft tissue mass upper back, cysts on face x 2, cysts on chest x 2  POSTOPERATIVE DIAGNOSIS: Same  PROCEDURE: Excision of mass on back, excision of 2 cysts on face, excision of 2 cysts on chest  SURGEON: Jeanann Lewandowsky, MD  ASSISTANT: Donnamarie Rossetti, Krista Blue  EBL: 10 cc  CONDITION: Stable  COMPLICATIONS: None  INDICATION: The patient, Anthony Shannon, is a 37 y.o. male born on [birth year], is here for treatment of a soft tissue mass on the back and multiple cysts on the face and chest.   PROCEDURE DETAILS:  The patient was seen prior to surgery and marked.  IV antibiotics were given. The patient was taken to the operating room and given a general anesthetic. A standard time out was performed and all information was confirmed by those in the room. SCDs were placed.   The patient was placed in the prone position and the back was prepped and draped sterilely.  Incision was made over the mass and dissection carried out down through the skin and dermis to the mass.  The mass was approximately 4 x 4 cm in size and densely adherent to the surrounding subcutaneous tissue and the overlying muscular fascia.  A combination of sharp and blunt dissection was used to remove the mass from the surrounding tissue.  The wound was irrigated with saline and hemostasis achieved with electrocautery.  I elected to close the wound rather than putting a drain in it.  The subcutaneous tissues were mobilized and sutured to the muscular fascia.  The dermis was closed with 2 layers of 3-0 Monocryl.  The skin was closed with a running 4-0 Monocryl subcuticular stitch.  And the incision was sealed with Dermabond.  The dressing was applied.  The patient was then transferred back to the supine position and the face and chest were prepped and draped sterilely.  The face was addressed first the 2 cysts were excised using a  combination of sharp and blunt dissection.  The wounds were irrigated and the skin edges were closed with interrupted 5-0 Prolene sutures.  The chest was then addressed the skin overlying the left cyst was excised and a combination of sharp and blunt dissection was used to completely excise the cyst and cyst wall.  The wound was irrigated and then closed in layers with 3-0 Monocryl in the dermis and interrupted 5-0 Prolene sutures in the skin.  The right sided cyst was likewise excised using combination of sharp and blunt dissection and the wound irrigated after the cyst and cyst wall were excised.  The wound was closed with interrupted 3-0 Monocryl sutures in the dermis and interrupted 5-0 Prolene sutures in the skin bacitracin was placed on the sutures and Band-Aids were placed.  All instrument needle and sponge counts were reported as correct, there were no complications. The patient was allowed to wake up and taken to recovery room in stable condition at the end of the case. The family was notified at the end of the case. The back incision was approximately 5 cm in length.  The 2 facial incisions were approximately 5 mm each in length.  Each of the chest incisions were approximately 1 cm in length.  The advanced practice practitioner (APP) assisted throughout the case.  The APP was essential in retraction and counter traction when needed to make the case progress smoothly.  This retraction and assistance made it possible to see  the tissue plans for the procedure.  The assistance was needed for blood control, tissue re-approximation and assisted with closure of the incision site.

## 2022-03-21 NOTE — Discharge Instructions (Addendum)
No tylenol until 6:15 p.m.  Diet: High protein, low sugar.  Medications: Please take the Zofran as needed for nausea symptoms.  As for pain control, please take ibuprofen 600 mg every 6 hours as well as Tylenol 500 mg every 6 hours as needed.  The prescribed oxycodone should be taken only as needed for breakthrough pain.  Please note that this drug can make you drowsy and you may not work or operate vehicles.  **Your oxycodone pain medication had not been picked up so it has likely expired. For this reason, I have reordered it to the [facility] on [address].  If your pain is not controlled with the over-the-counter medications detailed above, then you can go there to pick up the narcotic pain medication.**  Wound care: Your incision sites have open closed with sutures.  The excision sites on your face and chest were closed with a nonabsorbable suture that will need removed in clinic in 7 days.  The sutures on your back will dissolve.  They have all been dressed with bandages.  Mild incisional bleeding is not concerning, simply replace bandages as needed.  If there is a moderate amount of bleeding, hold pressure.  If it still does not stop, please call our on-call service.    Please call the office should you have severe pain or swelling, surrounding redness or malodor, wound dehiscence (if it completely opens), or if you have any fevers.      Post Anesthesia Home Care Instructions  Activity: Get plenty of rest for the remainder of the day. A responsible individual must stay with you for 24 hours following the procedure.  For the next 24 hours, DO NOT: -Drive a car -Paediatric nurse -Drink alcoholic beverages -Take any medication unless instructed by your physician -Make any legal decisions or sign important papers.  Meals: Start with liquid foods such as gelatin or soup. Progress to regular foods as tolerated. Avoid greasy, spicy, heavy foods. If nausea and/or vomiting occur, drink only  clear liquids until the nausea and/or vomiting subsides. Call your physician if vomiting continues.  Special Instructions/Symptoms: Your throat may feel dry or sore from the anesthesia or the breathing tube placed in your throat during surgery. If this causes discomfort, gargle with warm salt water. The discomfort should disappear within 24 hours.  If you had a scopolamine patch placed behind your ear for the management of post- operative nausea and/or vomiting:  1. The medication in the patch is effective for 72 hours, after which it should be removed.  Wrap patch in a tissue and discard in the trash. Wash hands thoroughly with soap and water. 2. You may remove the patch earlier than 72 hours if you experience unpleasant side effects which may include dry mouth, dizziness or visual disturbances. 3. Avoid touching the patch. Wash your hands with soap and water after contact with the patch.

## 2022-03-21 NOTE — Anesthesia Procedure Notes (Signed)
Procedure Name: Intubation Date/Time: 03/21/2022 1:39 PM  Performed by: Tawni Millers, CRNAPre-anesthesia Checklist: Patient identified, Emergency Drugs available, Suction available and Patient being monitored Patient Re-evaluated:Patient Re-evaluated prior to induction Oxygen Delivery Method: Circle system utilized Preoxygenation: Pre-oxygenation with 100% oxygen Induction Type: IV induction Ventilation: Mask ventilation without difficulty Laryngoscope Size: Mac and 4 Grade View: Grade II Tube type: Oral Tube size: 7.5 mm Number of attempts: 1 Airway Equipment and Method: Stylet and Oral airway Placement Confirmation: ETT inserted through vocal cords under direct vision, positive ETCO2 and breath sounds checked- equal and bilateral Tube secured with: Tape Dental Injury: Teeth and Oropharynx as per pre-operative assessment

## 2022-03-21 NOTE — Interval H&P Note (Signed)
History and Physical Interval Note: Pt met in the pre op holding area, no change in physical exam or indication for surgery. Five lesions marked with the patients assistance, two on the face in the glabellar region, two on the chest, one on the back. All questions answered to his satisfaction. Will proceed at his request,  03/21/2022 12:40 PM  Anthony Shannon  has presented today for surgery, with the diagnosis of Epidermal inclusion cyst.  The various methods of treatment have been discussed with the patient and family. After consideration of risks, benefits and other options for treatment, the patient has consented to  Procedure(s) with comments: EXCISION 7x7cm soft tissue mass from the back. (N/A) Excision of two cysts from face, two cysts from chest (N/A) - 2 face and 2 chest as a surgical intervention.  The patient's history has been reviewed, patient examined, no change in status, stable for surgery.  I have reviewed the patient's chart and labs.  Questions were answered to the patient's satisfaction.     Anthony Shannon

## 2022-03-21 NOTE — Anesthesia Preprocedure Evaluation (Addendum)
Anesthesia Evaluation  Patient identified by MRN, date of birth, ID band Patient awake    Reviewed: Allergy & Precautions, NPO status , Patient's Chart, lab work & pertinent test results  Airway Mallampati: III  TM Distance: >3 FB Neck ROM: Full    Dental  (+) Teeth Intact, Dental Advisory Given   Pulmonary neg pulmonary ROS   Pulmonary exam normal breath sounds clear to auscultation       Cardiovascular negative cardio ROS Normal cardiovascular exam Rhythm:Regular Rate:Normal     Neuro/Psych  Headaches    GI/Hepatic negative GI ROS, Neg liver ROS,,,  Endo/Other  Obesity   Renal/GU negative Renal ROS     Musculoskeletal negative musculoskeletal ROS (+)    Abdominal   Peds  Hematology negative hematology ROS (+)   Anesthesia Other Findings Day of surgery medications reviewed with the patient.  Reproductive/Obstetrics                             Anesthesia Physical Anesthesia Plan  ASA: 2  Anesthesia Plan: General   Post-op Pain Management: Tylenol PO (pre-op)* and Toradol IV (intra-op)*   Induction: Intravenous  PONV Risk Score and Plan: 2 and Midazolam, Dexamethasone and Ondansetron  Airway Management Planned: Oral ETT  Additional Equipment:   Intra-op Plan:   Post-operative Plan: Extubation in OR  Informed Consent: I have reviewed the patients History and Physical, chart, labs and discussed the procedure including the risks, benefits and alternatives for the proposed anesthesia with the patient or authorized representative who has indicated his/her understanding and acceptance.     Dental advisory given  Plan Discussed with: CRNA  Anesthesia Plan Comments:        Anesthesia Quick Evaluation

## 2022-03-21 NOTE — Transfer of Care (Signed)
Immediate Anesthesia Transfer of Care Note  Patient: Anthony Shannon [facility]  Procedure(s) Performed: EXCISION 7x7cm soft tissue mass from the back. (Back) Excision of two cysts from face, two cysts from chest (Face)  Patient Location: PACU  Anesthesia Type:General  Level of Consciousness: awake  Airway & Oxygen Therapy: Patient Spontanous Breathing and Patient connected to face mask oxygen  Post-op Assessment: Report given to RN and Post -op Vital signs reviewed and stable  Post vital signs: Reviewed and stable  Last Vitals:  Vitals Value Taken Time  BP    Temp    Pulse 98 03/21/22 1506  Resp 13 03/21/22 1506  SpO2 96 % 03/21/22 1506  Vitals shown include unvalidated device data.  Last Pain:  Vitals:   03/21/22 1134  TempSrc: Oral  PainSc: 0-No pain         Complications: No notable events documented.

## 2022-03-22 NOTE — Anesthesia Postprocedure Evaluation (Signed)
Anesthesia Post Note  Patient: Anthony Shannon  Procedure(s) Performed: EXCISION 7x7cm soft tissue mass from the back. (Back) Excision of two cysts from face, two cysts from chest (Face)     Patient location during evaluation: PACU Anesthesia Type: General Level of consciousness: awake and alert Pain management: pain level controlled Vital Signs Assessment: post-procedure vital signs reviewed and stable Respiratory status: spontaneous breathing, nonlabored ventilation, respiratory function stable and patient connected to nasal cannula oxygen Cardiovascular status: blood pressure returned to baseline and stable Postop Assessment: no apparent nausea or vomiting Anesthetic complications: no   No notable events documented.  Last Vitals:  Vitals:   03/21/22 1530 03/21/22 1550  BP: (!) 152/90 (!) 155/97  Pulse: 95 98  Resp: (!) 23 16  Temp:  36.5 C  SpO2: 99% 98%    Last Pain:  Vitals:   03/21/22 1550  TempSrc:   PainSc: 0-No pain                 Santa Lighter

## 2022-03-24 ENCOUNTER — Encounter (HOSPITAL_BASED_OUTPATIENT_CLINIC_OR_DEPARTMENT_OTHER): Payer: Self-pay | Admitting: Plastic Surgery

## 2022-03-25 LAB — SURGICAL PATHOLOGY

## 2022-03-25 NOTE — Progress Notes (Signed)
Patient is a pleasant 37 year old male with PMH of multiple cystic lesions on face and chest as well as a large soft tissue mass in the back s/p surgical excision performed 03/21/2018 for back Lovena Le who presents to clinic for postoperative follow-up and suture removal.  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  Today, patient is doing well.  States that the sutures are itchy, but otherwise no specific complaints.  He is hoping that they can be removed today.  On exam, excision sites all appear to be healing well, well-approximated.  Prolene interrupted sutures removed from the facial and chest excision sites in their entirety without complication or difficulty.  The Monocryl suture repair on the upper back was subcuticular.  Knots trimmed, residual Dermabond remained in place.  Good approximation.  Patient reassured by pathology.  Discussed leaving the sutures in place on the chest, but given that they are well-approximated and patient agrees to avoid strenuous activity, decided to proceed with removal.  Discussed continued activity modifications and Vaseline as needed over the face and chest x 1 week.  Afterwards, he can transition to silicone scar gels for scar mitigation.  No specific follow-up needed, but he can certainly call the clinic should he have any questions or concerns.  Picture(s) obtained of the patient and placed in the chart were with the patient's or guardian's permission.

## 2022-03-27 ENCOUNTER — Ambulatory Visit: Payer: No Typology Code available for payment source | Admitting: Physician Assistant

## 2022-03-27 VITALS — BP 132/77 | HR 69

## 2022-03-27 DIAGNOSIS — D179 Benign lipomatous neoplasm, unspecified: Secondary | ICD-10-CM

## 2022-03-27 DIAGNOSIS — L720 Epidermal cyst: Secondary | ICD-10-CM

## 2022-04-03 ENCOUNTER — Encounter: Payer: No Typology Code available for payment source | Admitting: Plastic Surgery

## 2022-05-12 ENCOUNTER — Encounter: Payer: Self-pay | Admitting: *Deleted

## 2022-05-15 NOTE — Progress Notes (Deleted)
Patient is a pleasant 37 year old male with PMH of multiple cystic lesions on face and chest as well as a large soft tissue mass in the back s/p surgical excision performed 03/21/2018 for back Anthony Shannon who presents to clinic for postoperative follow-up.  He was last seen here in clinic on 04/25/2022.  At that time, discussed reassuring pathology.  Exam was benign.  Monocryl knots were trimmed.  Incision CDI.  Discussed ongoing management.  No specific follow-up needed, but patient to return if needed.

## 2022-05-16 ENCOUNTER — Encounter: Payer: No Typology Code available for payment source | Admitting: Physician Assistant

## 2022-05-19 ENCOUNTER — Encounter: Payer: No Typology Code available for payment source | Admitting: Physician Assistant
